# Patient Record
Sex: Female | Born: 1980 | Race: Black or African American | Hispanic: No | Marital: Single
Health system: Southern US, Community
[De-identification: ages and names within clinical notes are randomized; demographics above are authoritative.]

---

## 2021-06-12 ENCOUNTER — Emergency Department (HOSPITAL_COMMUNITY): Payer: Medicaid - Out of State

## 2021-06-12 ENCOUNTER — Emergency Department (HOSPITAL_COMMUNITY)
Admission: EM | Admit: 2021-06-12 | Discharge: 2021-06-12 | Disposition: A | Discharge status: Home / Self Care | Payer: Medicaid - Out of State | Attending: Emergency Medicine | Admitting: Emergency Medicine

## 2021-06-12 DIAGNOSIS — X500XXA Overexertion from strenuous movement or load, initial encounter: Secondary | ICD-10-CM | POA: Diagnosis not present

## 2021-06-12 DIAGNOSIS — M79601 Pain in right arm: Secondary | ICD-10-CM | POA: Diagnosis present

## 2021-06-12 DIAGNOSIS — M25531 Pain in right wrist: Secondary | ICD-10-CM | POA: Insufficient documentation

## 2021-06-12 LAB — CBC WITH DIFFERENTIAL/PLATELET
Abs Immature Granulocytes: 0.01 10*3/uL (ref 0.00–0.07)
Basophils Absolute: 0 10*3/uL (ref 0.0–0.1)
Basophils Relative: 0 %
Eosinophils Absolute: 0.1 10*3/uL (ref 0.0–0.5)
Eosinophils Relative: 2 %
HCT: 36.7 % (ref 36.0–46.0)
Hemoglobin: 12.4 g/dL (ref 12.0–15.0)
Immature Granulocytes: 0 %
Lymphocytes Relative: 36 %
Lymphs Abs: 1.6 10*3/uL (ref 0.7–4.0)
MCH: 30.4 pg (ref 26.0–34.0)
MCHC: 33.8 g/dL (ref 30.0–36.0)
MCV: 90 fL (ref 80.0–100.0)
Monocytes Absolute: 0.4 10*3/uL (ref 0.1–1.0)
Monocytes Relative: 9 %
Neutro Abs: 2.4 10*3/uL (ref 1.7–7.7)
Neutrophils Relative %: 53 %
Platelets: 342 10*3/uL (ref 150–400)
RBC: 4.08 MIL/uL (ref 3.87–5.11)
RDW: 13.2 % (ref 11.5–15.5)
WBC: 4.5 10*3/uL (ref 4.0–10.5)
nRBC: 0 % (ref 0.0–0.2)

## 2021-06-12 LAB — BASIC METABOLIC PANEL
Anion gap: 6 (ref 5–15)
BUN: 9 mg/dL (ref 6–20)
CO2: 24 mmol/L (ref 22–32)
Calcium: 8.8 mg/dL — ABNORMAL LOW (ref 8.9–10.3)
Chloride: 108 mmol/L (ref 98–111)
Creatinine, Ser: 0.72 mg/dL (ref 0.44–1.00)
GFR, Estimated: >60 mL/min (ref >60)
Glucose, Bld: 91 mg/dL (ref 70–99)
Potassium: 4.3 mmol/L (ref 3.5–5.1)
Sodium: 138 mmol/L (ref 135–145)

## 2021-06-12 LAB — URIC ACID: Uric Acid, Serum: 2.7 mg/dL (ref 2.5–7.1)

## 2021-06-12 MED ORDER — KETOROLAC TROMETHAMINE 15 MG/ML IJ SOLN: 15.0000 mg | Freq: Once | INTRAMUSCULAR | Status: DC

## 2021-06-12 MED ORDER — NAPROXEN 500 MG PO TABS: 500.0000 mg | ORAL_TABLET | Freq: Two times a day (BID) | ORAL | 0 refills | Status: AC

## 2021-06-12 NOTE — ED Provider Notes (Signed)
[place] EMERGENCY DEPARTMENT Provider Note   CSN: [id number] Arrival date & time: 06/12/21  1003     History  Chief Complaint  Patient presents with   Wrist Pain   Arm Pain   Hand Pain    Robin Carey is a 41 y.o. female.  41 y.o female with no PMH presents to the ED with a chief complaint of right arm pain which began yesterday upon waking up.  Describes the pain as an aching sensation located along the right wrist, exacerbated with any kind of movement.  She states that she likely felt that she is level wrong, however this has not improved that she is unable to close her right hand.  Reports a fall about a month ago, which she states she ended up hitting the right knee and this is improving.  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  Without a fever, chills, other neurological symptoms.  The history is provided by the patient and medical records.  Wrist Pain  Arm Pain  Hand Pain      Home Medications Prior to Admission medications   Medication Sig Start Date End Date Taking? Authorizing Provider  naproxen (NAPROSYN) 500 MG tablet Take 1 tablet (500 mg total) by mouth 2 (two) times daily for 7 days. 06/12/21 06/19/21 Yes Janeece Fitting, PA-C      Allergies    Patient has no known allergies.    Review of Systems   Review of Systems  Constitutional:  Negative for chills and fever.  Musculoskeletal:  Positive for arthralgias.   Physical Exam Updated Vital Signs BP (!) 147/95 (BP Location: Left Arm)    Pulse 81    Temp 98.7 F (37.1 C) (Oral)    Resp 16    LMP 06/07/2021    SpO2 100%  Physical Exam Vitals and nursing note reviewed.  Constitutional:      Appearance: Normal appearance.  HENT:     Head: Normocephalic and atraumatic.  Eyes:     Pupils: Pupils are equal, round, and reactive to light.  Cardiovascular:     Rate and Rhythm: Normal rate.  Pulmonary:     Effort:  Pulmonary effort is normal.  Abdominal:     General: Abdomen is flat.  Musculoskeletal:        General: Tenderness present. No deformity.     Right wrist: Tenderness present. No swelling, deformity, effusion, lacerations, bony tenderness, snuff box tenderness or crepitus. Decreased range of motion.     Cervical back: Normal range of motion and neck supple.     Comments: Pulses present, capillary refill is intact.  Limited ROM due to pain.  No erythema or changes in the skin.  Skin:    General: Skin is warm and dry.  Neurological:     Mental Status: She is alert and oriented to person, place, and time.    ED Results / Procedures / Treatments   Labs (all labs ordered are listed, but only abnormal results are displayed) Labs Reviewed  BASIC METABOLIC PANEL - Abnormal; Notable for the following components:      Result Value   Calcium 8.8 (*)    All other components within normal limits  CBC WITH DIFFERENTIAL/PLATELET  URIC ACID    EKG None  Radiology DG Wrist Complete Right  Result Date: 06/12/2021 CLINICAL DATA:  Right hand and wrist pain for a few days after sleeping on it wrong. No  acute injury. EXAM: RIGHT HAND - COMPLETE 3+ VIEW; RIGHT WRIST - COMPLETE 3+ VIEW COMPARISON:  None. FINDINGS: The mineralization and alignment are normal. There is no evidence of acute fracture or dislocation. The joint spaces are preserved. No focal soft tissue abnormalities are identified. IMPRESSION: No acute osseous findings or significant soft tissue findings identified in the right hand or wrist. Electronically Signed   By: Richardean Sale M.D.   On: 06/12/2021 11:04   DG Hand Complete Right  Result Date: 06/12/2021 CLINICAL DATA:  Right hand and wrist pain for a few days after sleeping on it wrong. No acute injury. EXAM: RIGHT HAND - COMPLETE 3+ VIEW; RIGHT WRIST - COMPLETE 3+ VIEW COMPARISON:  None. FINDINGS: The mineralization and alignment are normal. There is no evidence of acute fracture or  dislocation. The joint spaces are preserved. No focal soft tissue abnormalities are identified. IMPRESSION: No acute osseous findings or significant soft tissue findings identified in the right hand or wrist. Electronically Signed   By: Richardean Sale M.D.   On: 06/12/2021 11:04    Procedures Procedures    Medications Ordered in ED Medications - No data to display  ED Course/ Medical Decision Making/ A&P                           Medical Decision Making Risk Prescription drug management.   Patient presents to the ED with right wrist pain that is been ongoing for the last couple days, reports she thought she likely slept wrong, no improvement despite rest.  Patient currently has a job that requires physical activity, she is concerned that she is unable to flex her hand.  Has been no trauma, no fevers, no prior history of IV drug use.  Labs on today's visit reveal a CBC with no leukocytosis, hemoglobin is within normal limits.  BMP with no electrolyte derangement to account for this pain noted to the right wrist.  Uric acid is negative no prior history of gout.  imaging was obtained while she awaited in waiting room which showed    DG hand/wrist: No acute osseous findings or significant soft tissue findings  identified in the right hand or wrist.      These results were discussed with patient at length.  We did discuss supportive treatment with a splint along with anti-inflammatories for a trial period.  She is concern as she would like further testing we discussed that this will likely need to be done by PCP along with orthopedist.    Portions of this note were generated with Dragon dictation software. Dictation errors may occur despite best attempts at proofreading.  Final Clinical Impression(s) / ED Diagnoses Final diagnoses:  Right wrist pain    Rx / DC Orders ED Discharge Orders          Ordered    naproxen (NAPROSYN) 500 MG tablet  2 times daily        06/12/21 1408               Janeece Fitting, PA-C 06/12/21 1438    Tegeler, Gwenyth Allegra, MD 06/12/21 [phone]

## 2021-06-12 NOTE — ED Provider Triage Note (Signed)
Emergency Medicine Provider Triage Evaluation Note  Antinette Keough , a 41 y.o. female  was evaluated in triage.  Pt complains of right hand and wrist pain for two days.  She is right handed.  No recent injury.  She denies fevers.  No history of similar.  She does note she sleeps on the right hand.     Physical Exam  BP (!) 147/95 (BP Location: Left Arm)    Pulse 81    Temp 98.7 F (37.1 C) (Oral)    Resp 16    LMP 06/07/2021    SpO2 100%  Gen:   Awake, no distress   Resp:  Normal effort  MSK:   Moves extremities without difficulty, obvious edema of the right hand/wrist. Other:  Right hand is not dusky, appears well perfused.   Medical Decision Making  Medically screening exam initiated at 10:34 AM.  Appropriate orders placed.  Elfriede Kruk was informed that the remainder of the evaluation will be completed by another provider, this initial triage assessment does not replace that evaluation, and the importance of remaining in the ED until their evaluation is complete.  Note: Portions of this report may have been transcribed using voice recognition software. Every effort was made to ensure accuracy; however, inadvertent computerized transcription errors may be present    Cristina Gong, PA-C 06/12/21 1101

## 2021-06-12 NOTE — ED Triage Notes (Signed)
Pt. Stated I might have sleep wrong, but my rt arm,wrist and hand are hurting so bad I had to call out of work today.

## 2021-06-12 NOTE — Discharge Instructions (Addendum)
The x-rays of your hand and wrist were within normal limits.  I have prescribed a short course of anti-inflammatories in order to help with your pain.  Please take 1 tablet twice a day with food for the next 7 days.  In addition you may ice, elevate your right wrist.  The number to our orthopedic specialist attached to your chart if you need further management.

## 2021-06-12 NOTE — Progress Notes (Signed)
Orthopedic Tech Progress Note Patient Details:  Robin Carey May 11, 1981 [id number]  Ortho Devices Type of Ortho Device: Velcro wrist splint Ortho Device/Splint Location: RUE Ortho Device/Splint Interventions: Ordered, Application, Adjustment   Post Interventions Patient Tolerated: Well Instructions Provided: Care of device  Donald Pore 06/12/2021, 2:37 PM

## 2023-01-23 IMAGING — DX DG WRIST COMPLETE 3+V*R*
4 series · 4 of 4 positions shown · non-contrast
Comparison: None.

CLINICAL DATA: Right hand and wrist pain for a few days after
sleeping on it wrong. No acute injury.

EXAM:
RIGHT HAND - COMPLETE 3+ VIEW; RIGHT WRIST - COMPLETE 3+ VIEW

[x wrist pa right]
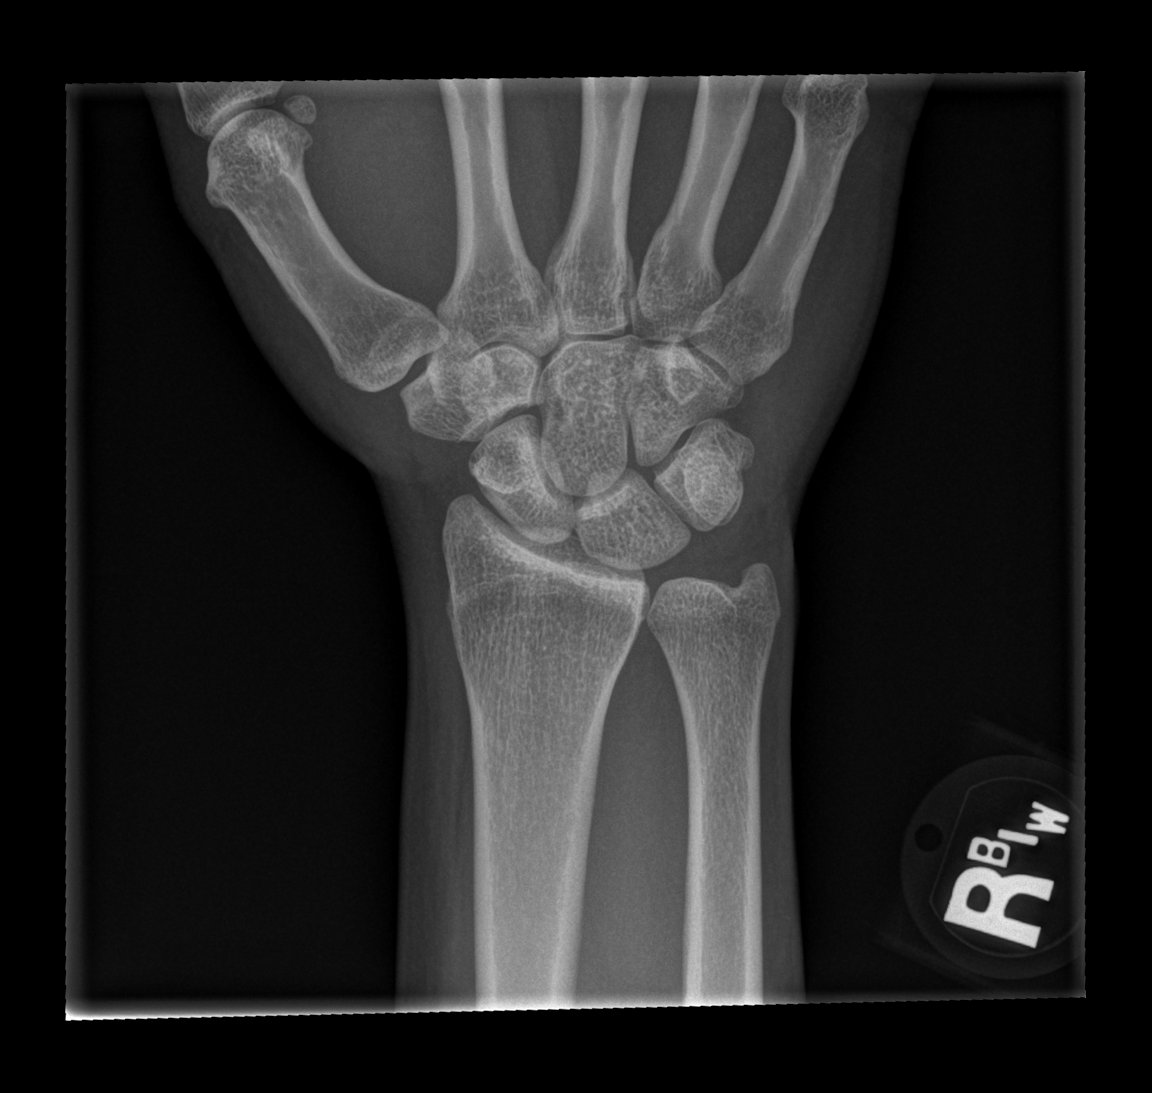

[x wrist obl right]
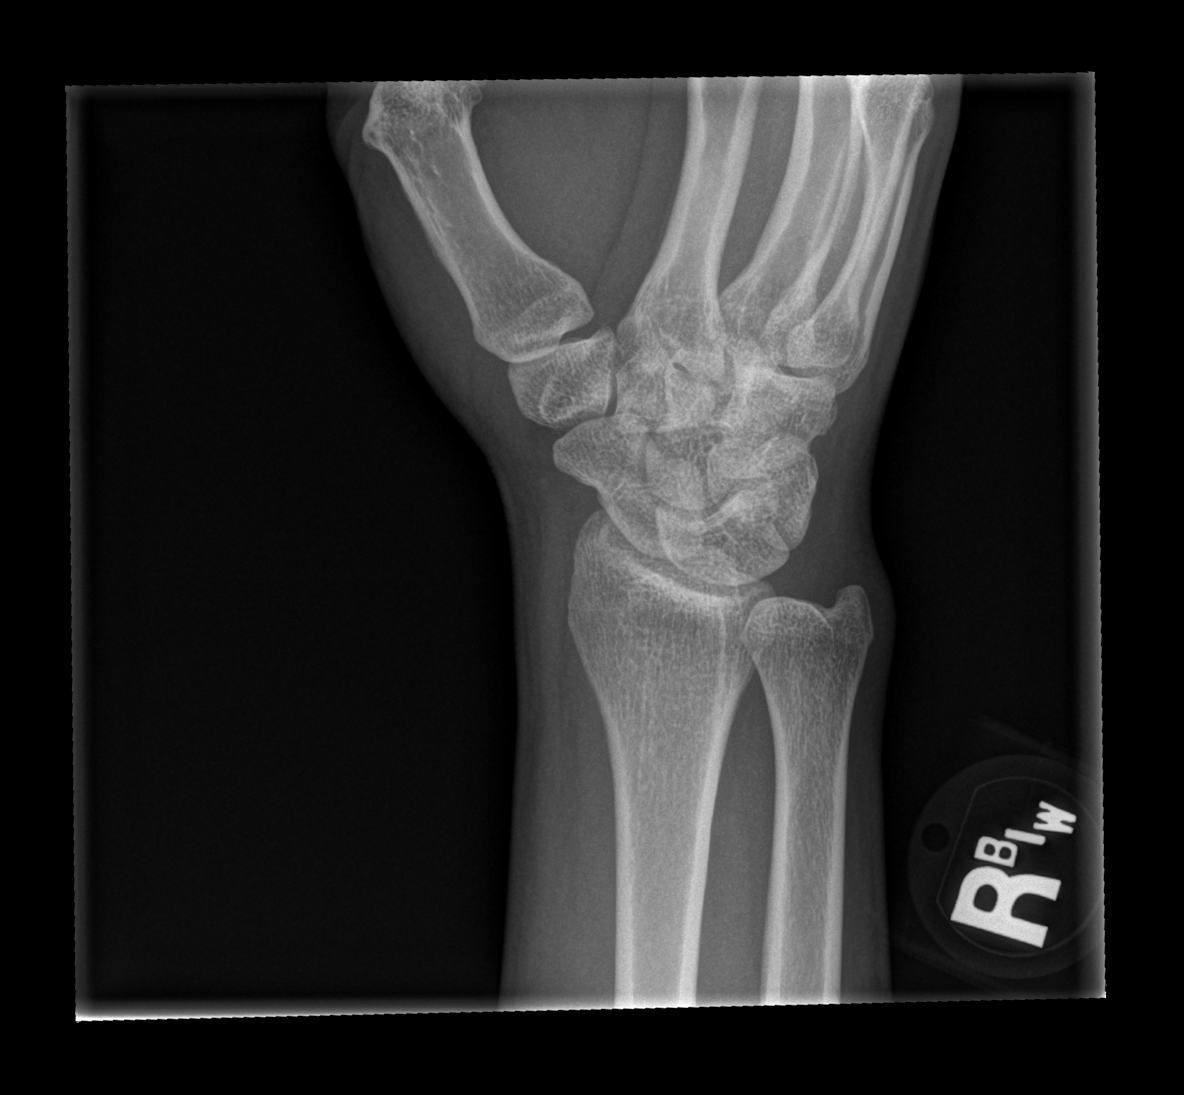

[x wrist lat right]
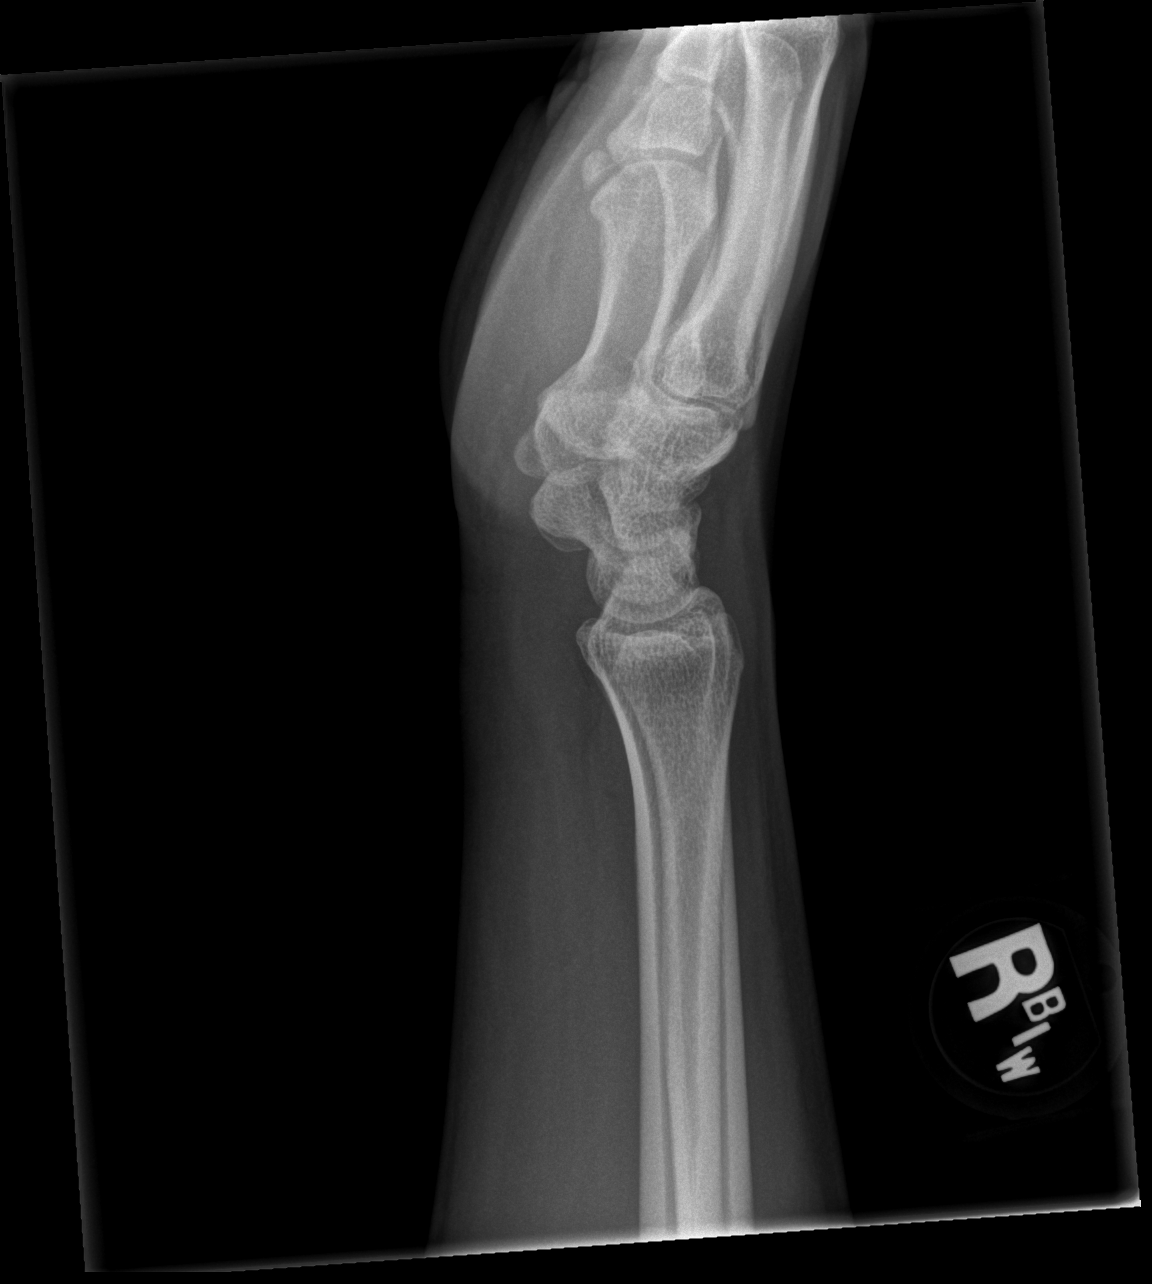

[x wrist navicular view right]
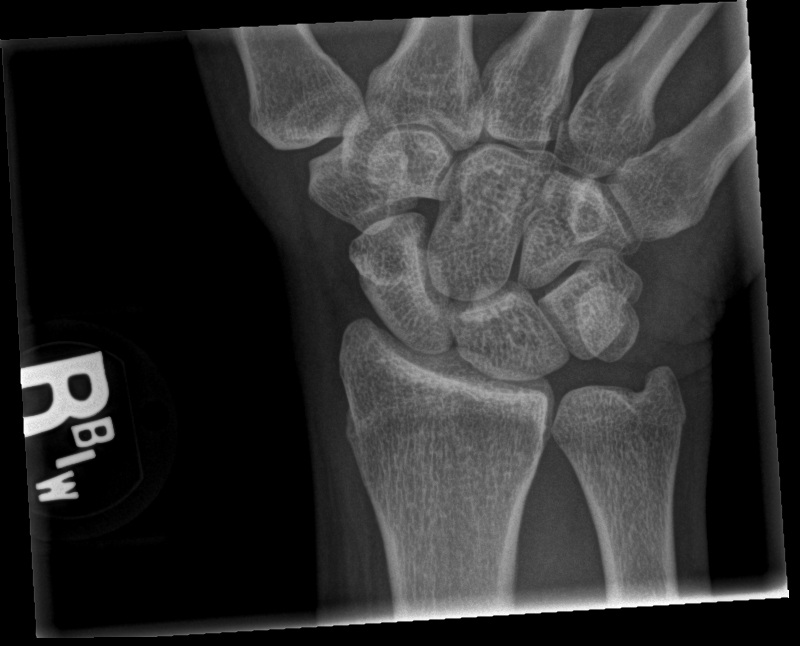

[4 of 4 positions shown; findings below may reference images not displayed]

FINDINGS: The mineralization and alignment are normal. There is no evidence of
acute fracture or dislocation. The joint spaces are preserved. No
focal soft tissue abnormalities are identified.
IMPRESSION: No acute osseous findings or significant soft tissue findings
identified in the right hand or wrist.

## 2023-01-23 IMAGING — DX DG HAND COMPLETE 3+V*R*
3 series · 3 of 3 positions shown · non-contrast
Comparison: None.

CLINICAL DATA: Right hand and wrist pain for a few days after
sleeping on it wrong. No acute injury.

EXAM:
RIGHT HAND - COMPLETE 3+ VIEW; RIGHT WRIST - COMPLETE 3+ VIEW

[x hand pa right]
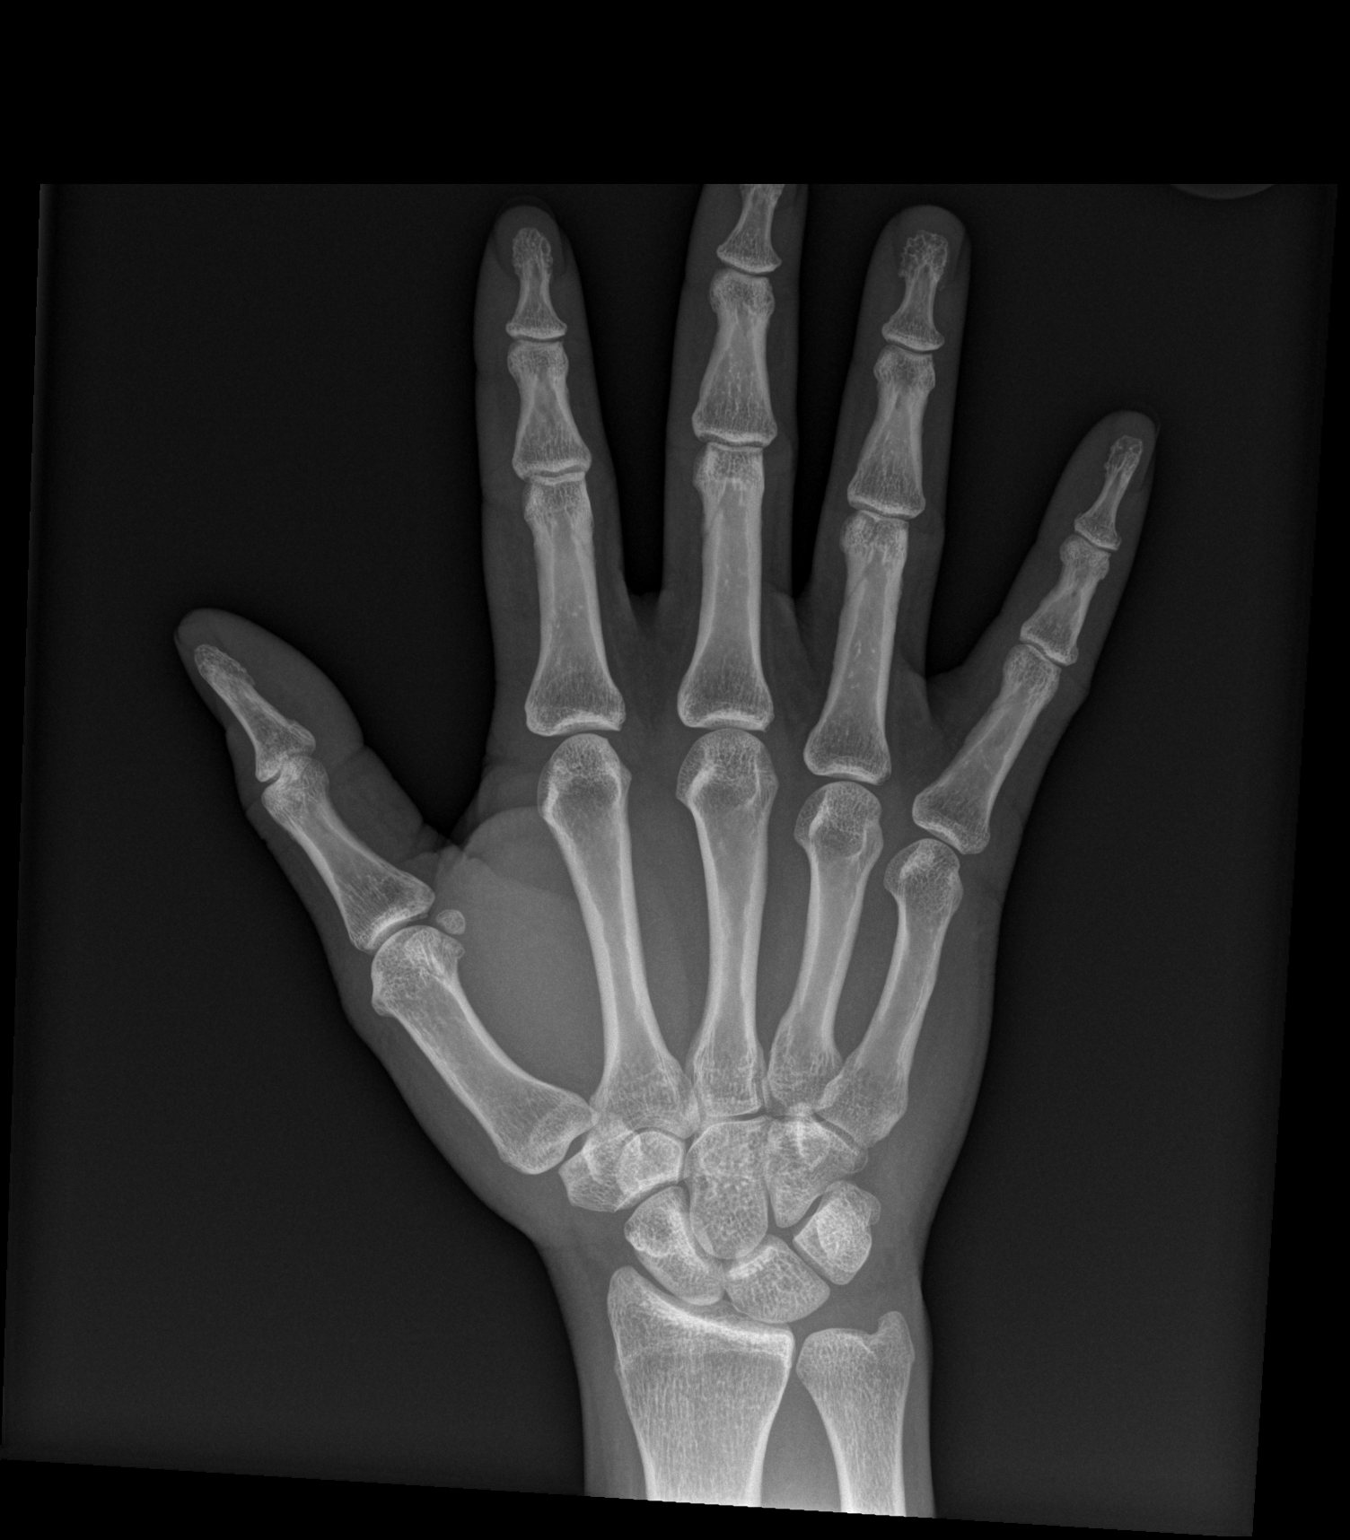

[x hand obl right]
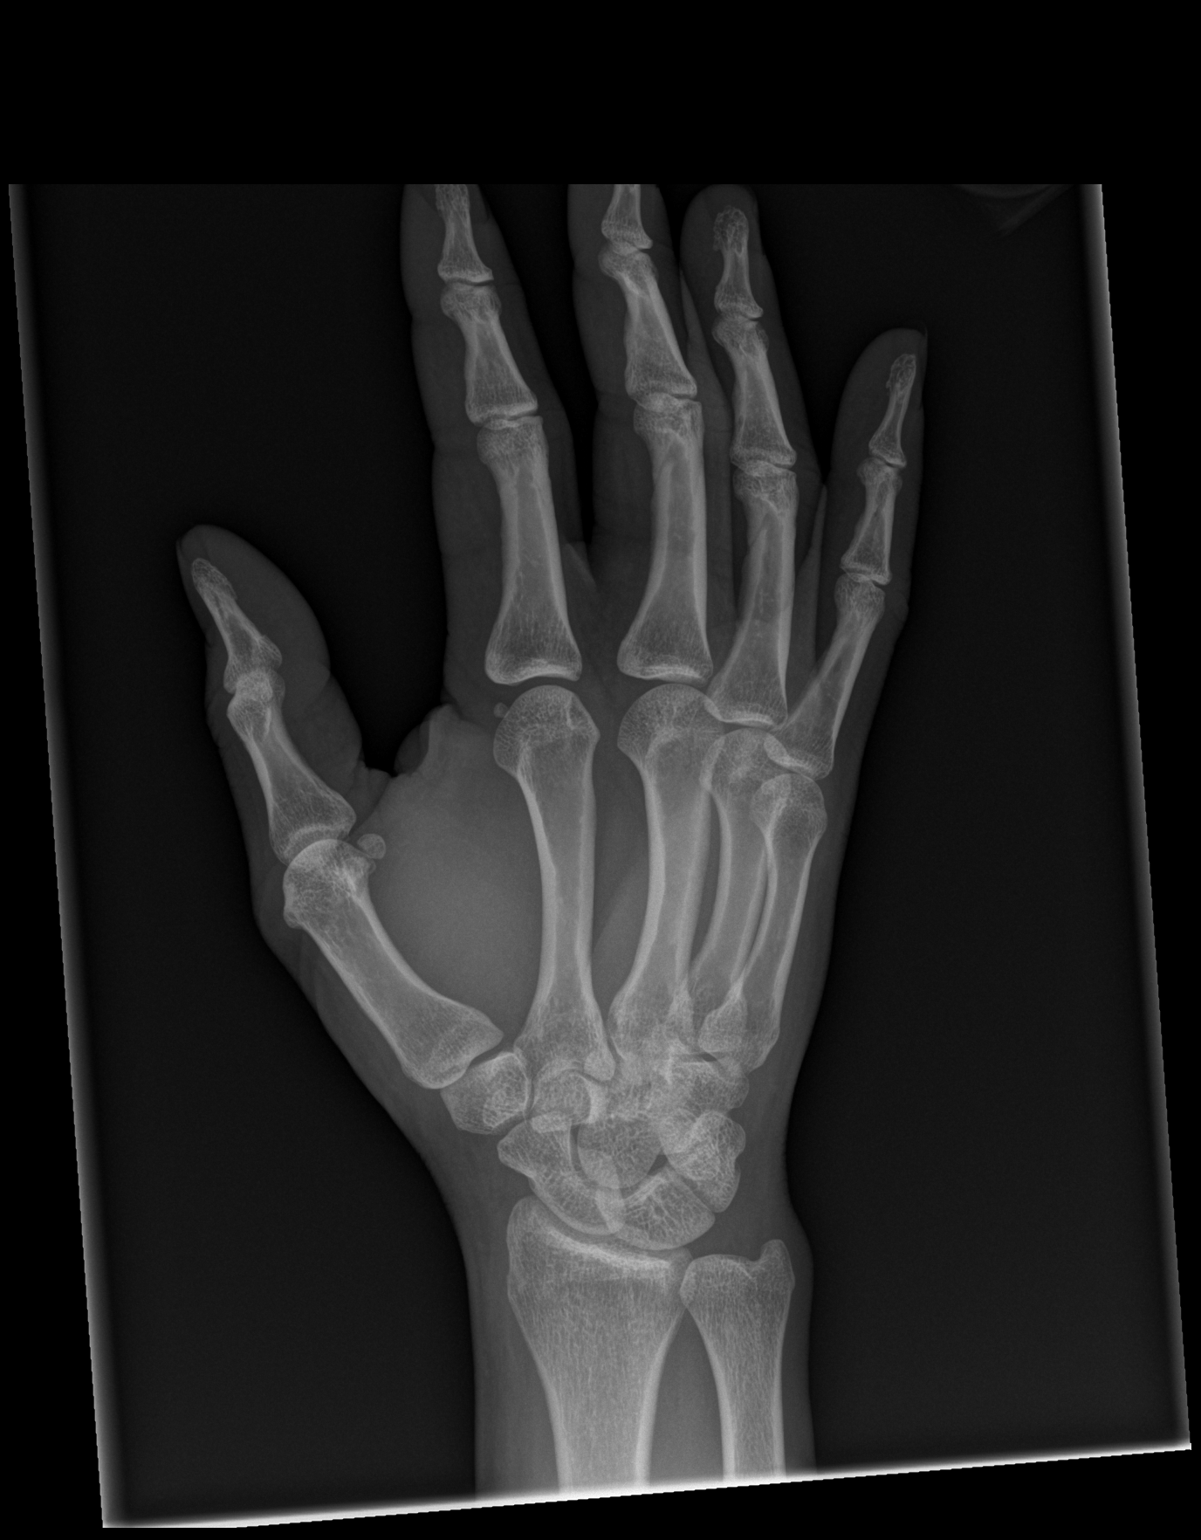

[x hand lat right]
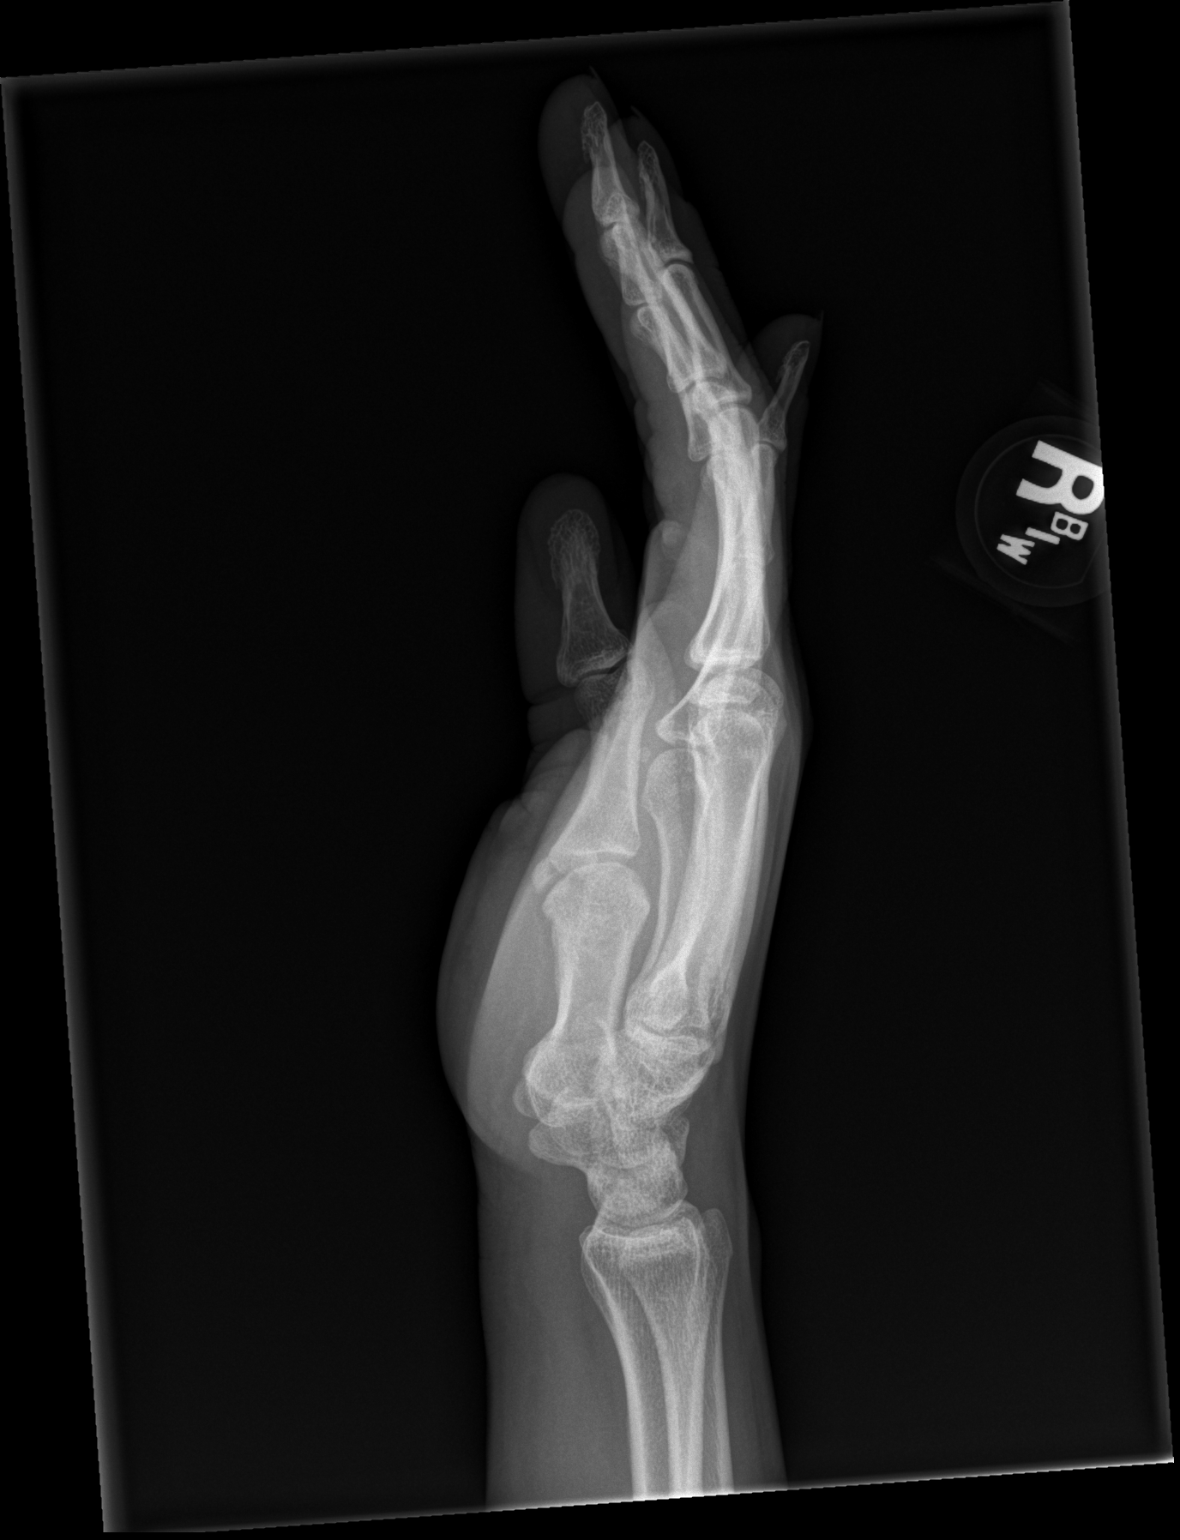

[3 of 3 positions shown; findings below may reference images not displayed]

FINDINGS: The mineralization and alignment are normal. There is no evidence of
acute fracture or dislocation. The joint spaces are preserved. No
focal soft tissue abnormalities are identified.
IMPRESSION: No acute osseous findings or significant soft tissue findings
identified in the right hand or wrist.
